# Patient Record
Sex: Female | Born: 1950 | Race: White | Hispanic: No | Marital: Married | Smoking: Never smoker
Health system: Southern US, Community
[De-identification: ages and names within clinical notes are randomized; demographics above are authoritative.]

## PROBLEM LIST (undated history)

## (undated) DIAGNOSIS — F329 Major depressive disorder, single episode, unspecified: Secondary | ICD-10-CM

## (undated) DIAGNOSIS — F32A Depression, unspecified: Secondary | ICD-10-CM

## (undated) HISTORY — PX: ABDOMINAL HYSTERECTOMY: SHX81

## (undated) HISTORY — PX: KNEE SURGERY: SHX244

## (undated) HISTORY — PX: INCONTINENCE SURGERY: SHX676

---

## 2014-12-29 ENCOUNTER — Emergency Department (HOSPITAL_BASED_OUTPATIENT_CLINIC_OR_DEPARTMENT_OTHER)
Admission: EM | Admit: 2014-12-29 | Discharge: 2014-12-29 | Disposition: A | Discharge status: Home / Self Care | Payer: Worker's Compensation | Attending: Emergency Medicine | Admitting: Emergency Medicine

## 2014-12-29 ENCOUNTER — Encounter (HOSPITAL_BASED_OUTPATIENT_CLINIC_OR_DEPARTMENT_OTHER): Payer: Self-pay | Admitting: Emergency Medicine

## 2014-12-29 DIAGNOSIS — Z880 Allergy status to penicillin: Secondary | ICD-10-CM | POA: Insufficient documentation

## 2014-12-29 DIAGNOSIS — T23171A Burn of first degree of right wrist, initial encounter: Secondary | ICD-10-CM

## 2014-12-29 DIAGNOSIS — Z79899 Other long term (current) drug therapy: Secondary | ICD-10-CM | POA: Diagnosis not present

## 2014-12-29 DIAGNOSIS — Y9289 Other specified places as the place of occurrence of the external cause: Secondary | ICD-10-CM | POA: Insufficient documentation

## 2014-12-29 DIAGNOSIS — F329 Major depressive disorder, single episode, unspecified: Secondary | ICD-10-CM | POA: Insufficient documentation

## 2014-12-29 DIAGNOSIS — Y990 Civilian activity done for income or pay: Secondary | ICD-10-CM | POA: Insufficient documentation

## 2014-12-29 DIAGNOSIS — Y9389 Activity, other specified: Secondary | ICD-10-CM | POA: Diagnosis not present

## 2014-12-29 DIAGNOSIS — T23071A Burn of unspecified degree of right wrist, initial encounter: Secondary | ICD-10-CM | POA: Diagnosis present

## 2014-12-29 DIAGNOSIS — T23271A Burn of second degree of right wrist, initial encounter: Secondary | ICD-10-CM | POA: Diagnosis not present

## 2014-12-29 DIAGNOSIS — X088XXA Exposure to other specified smoke, fire and flames, initial encounter: Secondary | ICD-10-CM | POA: Insufficient documentation

## 2014-12-29 HISTORY — DX: Major depressive disorder, single episode, unspecified: F32.9

## 2014-12-29 HISTORY — DX: Depression, unspecified: F32.A

## 2014-12-29 MED ORDER — HYDROCODONE-ACETAMINOPHEN 5-325 MG PO TABS: 1.0000 | ORAL_TABLET | ORAL | Status: AC | PRN

## 2014-12-29 MED ORDER — SILVER SULFADIAZINE 1 % EX CREA
TOPICAL_CREAM | CUTANEOUS | Status: AC
  Filled 2014-12-29: qty 85
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
## 2014-12-29 NOTE — ED Notes (Signed)
NP at bedside.
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
## 2014-12-29 NOTE — Discharge Instructions (Signed)
Burn Care Your skin is a natural barrier to infection. It is the largest organ of your body. Burns damage this natural protection. To help prevent infection, it is very important to follow your caregiver's instructions in the care of your burn. Burns are classified as:  First degree. There is only redness of the skin (erythema). No scarring is expected.  Second degree. There is blistering of the skin. Scarring may occur with deeper burns.  Third degree. All layers of the skin are injured, and scarring is expected. HOME CARE INSTRUCTIONS   Wash your hands well before changing your bandage.  Change your bandage as often as directed by your caregiver.  Remove the old bandage. If the bandage sticks, you may soak it off with cool, clean water.  Cleanse the burn thoroughly but gently with mild soap and water.  Pat the area dry with a clean, dry cloth.  Apply a thin layer of antibacterial cream to the burn.  Apply a clean bandage as instructed by your caregiver.  Keep the bandage as clean and dry as possible.  Elevate the affected area for the first 24 hours, then as instructed by your caregiver.  Only take over-the-counter or prescription medicines for pain, discomfort, or fever as directed by your caregiver. SEEK IMMEDIATE MEDICAL CARE IF:   You develop excessive pain.  You develop redness, tenderness, swelling, or red streaks near the burn.  The burned area develops yellowish-white fluid (pus) or a bad smell.  You have a fever. MAKE SURE YOU:   Understand these instructions.  Will watch your condition.  Will get help right away if you are not doing well or get worse. Document Released: 04/25/2005 Document Revised: 07/18/2011 Document Reviewed: 09/15/2010 [employer] Patient Information 2015 [employer]. This information is not intended to replace advice given to you by your health care provider. Make sure you discuss any questions you have with your health care  provider.  

## 2015-01-25 ENCOUNTER — Emergency Department (HOSPITAL_BASED_OUTPATIENT_CLINIC_OR_DEPARTMENT_OTHER)
Admission: EM | Admit: 2015-01-25 | Discharge: 2015-01-25 | Disposition: A | Discharge status: Home / Self Care | Payer: BLUE CROSS/BLUE SHIELD | Attending: Emergency Medicine | Admitting: Emergency Medicine

## 2015-01-25 ENCOUNTER — Emergency Department (HOSPITAL_BASED_OUTPATIENT_CLINIC_OR_DEPARTMENT_OTHER): Payer: BLUE CROSS/BLUE SHIELD

## 2015-01-25 ENCOUNTER — Encounter (HOSPITAL_BASED_OUTPATIENT_CLINIC_OR_DEPARTMENT_OTHER): Payer: Self-pay | Admitting: Emergency Medicine

## 2015-01-25 DIAGNOSIS — S29012A Strain of muscle and tendon of back wall of thorax, initial encounter: Secondary | ICD-10-CM | POA: Insufficient documentation

## 2015-01-25 DIAGNOSIS — R202 Paresthesia of skin: Secondary | ICD-10-CM | POA: Diagnosis not present

## 2015-01-25 DIAGNOSIS — Y92410 Unspecified street and highway as the place of occurrence of the external cause: Secondary | ICD-10-CM | POA: Diagnosis not present

## 2015-01-25 DIAGNOSIS — Y9389 Activity, other specified: Secondary | ICD-10-CM | POA: Diagnosis not present

## 2015-01-25 DIAGNOSIS — S161XXA Strain of muscle, fascia and tendon at neck level, initial encounter: Secondary | ICD-10-CM | POA: Diagnosis not present

## 2015-01-25 DIAGNOSIS — Z79899 Other long term (current) drug therapy: Secondary | ICD-10-CM | POA: Diagnosis not present

## 2015-01-25 DIAGNOSIS — S29019A Strain of muscle and tendon of unspecified wall of thorax, initial encounter: Secondary | ICD-10-CM

## 2015-01-25 DIAGNOSIS — Y998 Other external cause status: Secondary | ICD-10-CM | POA: Insufficient documentation

## 2015-01-25 DIAGNOSIS — F329 Major depressive disorder, single episode, unspecified: Secondary | ICD-10-CM | POA: Diagnosis not present

## 2015-01-25 DIAGNOSIS — S4992XA Unspecified injury of left shoulder and upper arm, initial encounter: Secondary | ICD-10-CM | POA: Insufficient documentation

## 2015-01-25 DIAGNOSIS — S299XXA Unspecified injury of thorax, initial encounter: Secondary | ICD-10-CM | POA: Diagnosis present

## 2015-01-25 DIAGNOSIS — Z880 Allergy status to penicillin: Secondary | ICD-10-CM | POA: Diagnosis not present

## 2015-01-25 MED ORDER — METHOCARBAMOL 500 MG PO TABS: 500.0000 mg | ORAL_TABLET | Freq: Two times a day (BID) | ORAL | Status: DC

## 2015-01-25 MED ORDER — NAPROXEN 500 MG PO TABS: 500.0000 mg | ORAL_TABLET | Freq: Two times a day (BID) | ORAL | Status: AC
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
## 2015-01-25 NOTE — Discharge Instructions (Signed)
Take naprosyn as prescribed as needed for pain and inflammation. Take robaxin for muscle spasms. Rest. Stretches. Follow up with primary care doctor in the office if symptoms do not improve. Return if worsening.   Cervical Sprain A cervical sprain is an injury in the neck in which the strong, fibrous tissues (ligaments) that connect your neck bones stretch or tear. Cervical sprains can range from mild to severe. Severe cervical sprains can cause the neck vertebrae to be unstable. This can lead to damage of the spinal cord and can result in serious nervous system problems. The amount of time it takes for a cervical sprain to get better depends on the cause and extent of the injury. Most cervical sprains heal in 1 to 3 weeks. CAUSES  Severe cervical sprains may be caused by:   Contact sport injuries (such as from football, rugby, wrestling, hockey, auto racing, gymnastics, diving, martial arts, or boxing).   Motor vehicle collisions.   Whiplash injuries. This is an injury from a sudden forward and backward whipping movement of the head and neck.  Falls.  Mild cervical sprains may be caused by:   Being in an awkward position, such as while cradling a telephone between your ear and shoulder.   Sitting in a chair that does not offer proper support.   Working at a poorly Marketing executive station.   Looking up or down for long periods of time.  SYMPTOMS   Pain, soreness, stiffness, or a burning sensation in the front, back, or sides of the neck. This discomfort may develop immediately after the injury or slowly, 24 hours or more after the injury.   Pain or tenderness directly in the middle of the back of the neck.   Shoulder or upper back pain.   Limited ability to move the neck.   Headache.   Dizziness.   Weakness, numbness, or tingling in the hands or arms.   Muscle spasms.   Difficulty swallowing or chewing.   Tenderness and swelling of the neck.  DIAGNOSIS    Most of the time your health care provider can diagnose a cervical sprain by taking your history and doing a physical exam. Your health care provider will ask about previous neck injuries and any known neck problems, such as arthritis in the neck. X-rays may be taken to find out if there are any other problems, such as with the bones of the neck. Other tests, such as a CT scan or MRI, may also be needed.  TREATMENT  Treatment depends on the severity of the cervical sprain. Mild sprains can be treated with rest, keeping the neck in place (immobilization), and pain medicines. Severe cervical sprains are immediately immobilized. Further treatment is done to help with pain, muscle spasms, and other symptoms and may include:  Medicines, such as pain relievers, numbing medicines, or muscle relaxants.   Physical therapy. This may involve stretching exercises, strengthening exercises, and posture training. Exercises and improved posture can help stabilize the neck, strengthen muscles, and help stop symptoms from returning.  HOME CARE INSTRUCTIONS   Put ice on the injured area.   Put ice in a plastic bag.   Place a towel between your skin and the bag.   Leave the ice on for 15-20 minutes, 3-4 times a day.   If your injury was severe, you may have been given a cervical collar to wear. A cervical collar is a two-piece collar designed to keep your neck from moving while it heals.  Do  not remove the collar unless instructed by your health care provider.  If you have long hair, keep it outside of the collar.  Ask your health care provider before making any adjustments to your collar. Minor adjustments may be required over time to improve comfort and reduce pressure on your chin or on the back of your head.  Ifyou are allowed to remove the collar for cleaning or bathing, follow your health care provider's instructions on how to do so safely.  Keep your collar clean by wiping it with mild soap  and water and drying it completely. If the collar you have been given includes removable pads, remove them every 1-2 days and hand wash them with soap and water. Allow them to air dry. They should be completely dry before you wear them in the collar.  If you are allowed to remove the collar for cleaning and bathing, wash and dry the skin of your neck. Check your skin for irritation or sores. If you see any, tell your health care provider.  Do not drive while wearing the collar.   Only take over-the-counter or prescription medicines for pain, discomfort, or fever as directed by your health care provider.   Keep all follow-up appointments as directed by your health care provider.   Keep all physical therapy appointments as directed by your health care provider.   Make any needed adjustments to your workstation to promote good posture.   Avoid positions and activities that make your symptoms worse.   Warm up and stretch before being active to help prevent problems.  SEEK MEDICAL CARE IF:   Your pain is not controlled with medicine.   You are unable to decrease your pain medicine over time as planned.   Your activity level is not improving as expected.  SEEK IMMEDIATE MEDICAL CARE IF:   You develop any bleeding.  You develop stomach upset.  You have signs of an allergic reaction to your medicine.   Your symptoms get worse.   You develop new, unexplained symptoms.   You have numbness, tingling, weakness, or paralysis in any part of your body.  MAKE SURE YOU:   Understand these instructions.  Will watch your condition.  Will get help right away if you are not doing well or get worse. Document Released: 02/20/2007 Document Revised: 04/30/2013 Document Reviewed: 10/31/2012 [facility] Patient Information 2015 [place], [place]. This information is not intended to replace advice given to you by your health care provider. Make sure you discuss any questions you have with  your health care provider.  Motor Vehicle Collision It is common to have multiple bruises and sore muscles after a motor vehicle collision (MVC). These tend to feel worse for the first 24 hours. You may have the most stiffness and soreness over the first several hours. You may also feel worse when you wake up the first morning after your collision. After this point, you will usually begin to improve with each day. The speed of improvement often depends on the severity of the collision, the number of injuries, and the location and nature of these injuries. HOME CARE INSTRUCTIONS  Put ice on the injured area.  Put ice in a plastic bag.  Place a towel between your skin and the bag.  Leave the ice on for 15-20 minutes, 3-4 times a day, or as directed by your health care provider.  Drink enough fluids to keep your urine clear or pale yellow. Do not drink alcohol.  Take a warm shower  or bath once or twice a day. This will increase blood flow to sore muscles.  You may return to activities as directed by your caregiver. Be careful when lifting, as this may aggravate neck or back pain.  Only take over-the-counter or prescription medicines for pain, discomfort, or fever as directed by your caregiver. Do not use aspirin. This may increase bruising and bleeding. SEEK IMMEDIATE MEDICAL CARE IF:  You have numbness, tingling, or weakness in the arms or legs.  You develop severe headaches not relieved with medicine.  You have severe neck pain, especially tenderness in the middle of the back of your neck.  You have changes in bowel or bladder control.  There is increasing pain in any area of the body.  You have shortness of breath, light-headedness, dizziness, or fainting.  You have chest pain.  You feel sick to your stomach (nauseous), throw up (vomit), or sweat.  You have increasing abdominal discomfort.  There is blood in your urine, stool, or vomit.  You have pain in your shoulder  (shoulder strap areas).  You feel your symptoms are getting worse. MAKE SURE YOU:  Understand these instructions.  Will watch your condition.  Will get help right away if you are not doing well or get worse. Document Released: 04/25/2005 Document Revised: 09/09/2013 Document Reviewed: 09/22/2010 [facility] Patient Information 2015 [place], [place]. This information is not intended to replace advice given to you by your health care provider. Make sure you discuss any questions you have with your health care provider.

## 2015-01-29 ENCOUNTER — Encounter (HOSPITAL_BASED_OUTPATIENT_CLINIC_OR_DEPARTMENT_OTHER): Payer: Self-pay | Admitting: Emergency Medicine

## 2015-01-29 ENCOUNTER — Emergency Department (HOSPITAL_BASED_OUTPATIENT_CLINIC_OR_DEPARTMENT_OTHER)
Admission: EM | Admit: 2015-01-29 | Discharge: 2015-01-29 | Disposition: A | Discharge status: Home / Self Care | Payer: BLUE CROSS/BLUE SHIELD | Attending: Emergency Medicine | Admitting: Emergency Medicine

## 2015-01-29 DIAGNOSIS — Y9389 Activity, other specified: Secondary | ICD-10-CM | POA: Diagnosis not present

## 2015-01-29 DIAGNOSIS — Y92410 Unspecified street and highway as the place of occurrence of the external cause: Secondary | ICD-10-CM | POA: Insufficient documentation

## 2015-01-29 DIAGNOSIS — Y998 Other external cause status: Secondary | ICD-10-CM | POA: Insufficient documentation

## 2015-01-29 DIAGNOSIS — S4991XA Unspecified injury of right shoulder and upper arm, initial encounter: Secondary | ICD-10-CM | POA: Diagnosis present

## 2015-01-29 DIAGNOSIS — F329 Major depressive disorder, single episode, unspecified: Secondary | ICD-10-CM | POA: Diagnosis not present

## 2015-01-29 DIAGNOSIS — Z79899 Other long term (current) drug therapy: Secondary | ICD-10-CM | POA: Diagnosis not present

## 2015-01-29 DIAGNOSIS — S29019A Strain of muscle and tendon of unspecified wall of thorax, initial encounter: Secondary | ICD-10-CM

## 2015-01-29 DIAGNOSIS — S29012A Strain of muscle and tendon of back wall of thorax, initial encounter: Secondary | ICD-10-CM | POA: Insufficient documentation

## 2015-01-29 MED ORDER — METHOCARBAMOL 500 MG PO TABS: 500.0000 mg | ORAL_TABLET | Freq: Two times a day (BID) | ORAL | Status: AC
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
## 2015-01-29 NOTE — Discharge Instructions (Signed)
Robaxin as prescribed as needed for pain.  Follow-up with your primary Dr. if not improving in the next 3 days.  Thoracic Strain You have injured the muscles or tendons that attach to the upper part of your back behind your chest. This injury is called a thoracic strain, thoracic sprain, or mid-back strain.  CAUSES  The cause of thoracic strain varies. A less severe injury involves pulling a muscle or tendon without tearing it. A more severe injury involves tearing (rupturing) a muscle or tendon. With less severe injuries, there may be little loss of strength. Sometimes, there are breaks (fractures) in the bones to which the muscles are attached. These fractures are rare, unless there was a direct hit (trauma) or you have weak bones due to osteoporosis or age. Longstanding strains may be caused by overuse or improper form during certain movements. Obesity can also increase your risk for back injuries. Sudden strains may occur due to injury or not warming up properly before exercise. Often, there is no obvious cause for a thoracic strain. SYMPTOMS  The main symptom is pain, especially with movement, such as during exercise. DIAGNOSIS  Your caregiver can usually tell what is wrong by taking an X-ray and doing a physical exam. TREATMENT   Physical therapy may be helpful for recovery. Your caregiver can give you exercises to do or refer you to a physical therapist after your pain improves.  After your pain improves, strengthening and conditioning programs appropriate for your sport or occupation may be helpful.  Always warm up before physical activities or athletics. Stretching after physical activity may also help.  Certain over-the-counter medicines may also help. Ask your caregiver if there are medicines that would help you. If this is your first thoracic strain injury, proper care and proper healing time before starting activities should prevent long-term problems. Torn ligaments and tendons  require as long to heal as broken bones. Average healing times may be only 1 week for a mild strain. For torn muscles and tendons, healing time may be up to 6 weeks to 2 months. HOME CARE INSTRUCTIONS   Apply ice to the injured area. Ice massages may also be used as directed.  Put ice in a plastic bag.  Place a towel between your skin and the bag.  Leave the ice on for 15-20 minutes, 03-04 times a day, for the first 2 days.  Only take over-the-counter or prescription medicines for pain, discomfort, or fever as directed by your caregiver.  Keep your appointments for physical therapy if this was prescribed.  Use wraps and back braces as instructed. SEEK IMMEDIATE MEDICAL CARE IF:   You have an increase in bruising, swelling, or pain.  Your pain has not improved with medicines.  You develop new shortness of breath, chest pain, or fever.  Problems seem to be getting worse rather than better. MAKE SURE YOU:   Understand these instructions.  Will watch your condition.  Will get help right away if you are not doing well or get worse. Document Released: 07/16/2003 Document Revised: 07/18/2011 Document Reviewed: 06/11/2010 [facility] Patient Information 2015 [place], [place]. This information is not intended to replace advice given to you by your health care provider. Make sure you discuss any questions you have with your health care provider.

## 2015-01-29 NOTE — ED Notes (Signed)
Pt was seen on sun for same. Pt states still having pain in neck and left arm with movement. Pt states she was told to return to ED if pain continues. Taking medications as prescribed.
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
